# Patient Record
Sex: Male | Born: 1995 | Race: Black or African American | Hispanic: No | Marital: Married | Smoking: Never smoker
Health system: Southern US, Community
[De-identification: ages and names within clinical notes are randomized; demographics above are authoritative.]

## PROBLEM LIST (undated history)

## (undated) DIAGNOSIS — J45909 Unspecified asthma, uncomplicated: Secondary | ICD-10-CM

---
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
## 2016-04-10 ENCOUNTER — Emergency Department (HOSPITAL_COMMUNITY): Payer: BLUE CROSS/BLUE SHIELD

## 2016-04-10 ENCOUNTER — Emergency Department (HOSPITAL_COMMUNITY)
Admission: EM | Admit: 2016-04-10 | Discharge: 2016-04-10 | Disposition: A | Discharge status: Home / Self Care | Payer: BLUE CROSS/BLUE SHIELD | Attending: Emergency Medicine | Admitting: Emergency Medicine

## 2016-04-10 ENCOUNTER — Encounter (HOSPITAL_COMMUNITY): Payer: Self-pay | Admitting: Emergency Medicine

## 2016-04-10 DIAGNOSIS — R0789 Other chest pain: Secondary | ICD-10-CM | POA: Diagnosis present

## 2016-04-10 DIAGNOSIS — J45909 Unspecified asthma, uncomplicated: Secondary | ICD-10-CM | POA: Insufficient documentation

## 2016-04-10 DIAGNOSIS — Z79899 Other long term (current) drug therapy: Secondary | ICD-10-CM | POA: Diagnosis not present

## 2016-04-10 HISTORY — DX: Unspecified asthma, uncomplicated: J45.909

## 2016-04-10 LAB — BASIC METABOLIC PANEL
ANION GAP: 11 (ref 5–15)
BUN: 14 mg/dL (ref 6–20)
CALCIUM: 9.5 mg/dL (ref 8.9–10.3)
CO2: 26 mmol/L (ref 22–32)
Chloride: 101 mmol/L (ref 101–111)
Creatinine, Ser: 1.01 mg/dL (ref 0.61–1.24)
GLUCOSE: 99 mg/dL (ref 65–99)
POTASSIUM: 3.5 mmol/L (ref 3.5–5.1)
SODIUM: 138 mmol/L (ref 135–145)

## 2016-04-10 LAB — CBC
HEMATOCRIT: 44.5 % (ref 39.0–52.0)
HEMOGLOBIN: 14.7 g/dL (ref 13.0–17.0)
MCH: 27.9 pg (ref 26.0–34.0)
MCHC: 33 g/dL (ref 30.0–36.0)
MCV: 84.6 fL (ref 78.0–100.0)
Platelets: 229 10*3/uL (ref 150–400)
RBC: 5.26 MIL/uL (ref 4.22–5.81)
RDW: 12.3 % (ref 11.5–15.5)
WBC: 4.5 10*3/uL (ref 4.0–10.5)

## 2016-04-10 LAB — I-STAT TROPONIN, ED: TROPONIN I, POC: 0 ng/mL (ref 0.00–0.08)

## 2016-04-10 NOTE — ED Triage Notes (Signed)
Pt c/o substernal to L sided CP x 2 days, worse when taking deep breaths or bending over. Denies SOB/dizziness/N/V. Resp e/u, skin warm/dry.

## 2016-04-10 NOTE — ED Notes (Signed)
Pt verbalized understanding of d/c instructions and has no further questions. Pt stable and NAD. Lungs clear and patient is pain free upon d/c.

## 2016-04-10 NOTE — ED Provider Notes (Signed)
[facility]-EMERGENCY DEPT Provider Note   CSN: [id number] Arrival date & time: 04/10/16  1507     History   Chief Complaint Chief Complaint  Patient presents with  . Chest Pain    HPI Rodney Lucero is a 21 y.o. male.  Patient is an otherwise healthy 21 year old male presents with complaints of chest discomfort. This is located in the center of his chest and is not associated with any shortness of breath, nausea, diaphoresis, or radiation to the arm or jaw. He denies any exertional component. His pain is worse with sitting upright.   The history is provided by the patient.  Chest Pain   This is a new problem. The current episode started 2 days ago. The problem occurs constantly. The problem has been gradually worsening. The pain is mild. The quality of the pain is described as pressure-like. The pain does not radiate. The symptoms are aggravated by certain positions. Pertinent negatives include no cough, no diaphoresis, no fever and no shortness of breath.    Past Medical History:  Diagnosis Date  . Asthma     There are no active problems to display for this patient.   History reviewed. No pertinent surgical history.     Home Medications    Prior to Admission medications   Not on File    Family History No family history on file.  Social History Social History  Substance Use Topics  . Smoking status: Never Smoker  . Smokeless tobacco: Never Used  . Alcohol use Yes     Comment: occ     Allergies   Patient has no known allergies.   Review of Systems Review of Systems  Constitutional: Negative for diaphoresis and fever.  Respiratory: Negative for cough and shortness of breath.   Cardiovascular: Positive for chest pain.  All other systems reviewed and are negative.    Physical Exam Updated Vital Signs BP 115/57 (BP Location: Right Arm)   Pulse (!) 57   Temp (P) 98.5 F (36.9 C) (Oral)   Resp 15   Ht 5\' 9"  (1.753 m)   Wt 145 lb (65.8 kg)   SpO2 100%    BMI 21.41 kg/m   Physical Exam  Constitutional: He is oriented to person, place, and time. He appears well-developed and well-nourished. No distress.  HENT:  Head: Normocephalic and atraumatic.  Mouth/Throat: Oropharynx is clear and moist.  Neck: Normal range of motion. Neck supple.  Cardiovascular: Normal rate and regular rhythm.  Exam reveals no friction rub.   No murmur heard. Pulmonary/Chest: Effort normal and breath sounds normal. No respiratory distress. He has no wheezes. He has no rales.  Abdominal: Soft. Bowel sounds are normal. He exhibits no distension. There is no tenderness.  Musculoskeletal: Normal range of motion. He exhibits no edema.  There is no calf tenderness or swelling. Homans sign is absent bilaterally.  Neurological: He is alert and oriented to person, place, and time. Coordination normal.  Skin: Skin is warm and dry. He is not diaphoretic.  Nursing note and vitals reviewed.    ED Treatments / Results  Labs (all labs ordered are listed, but only abnormal results are displayed) Labs Reviewed  BASIC METABOLIC PANEL  CBC  I-STAT TROPOININ, ED    EKG  EKG Interpretation  Date/Time:  Tuesday April 10 2016 15:19:49 EDT Ventricular Rate:  76 PR Interval:  124 QRS Duration: 86 QT Interval:  378 QTC Calculation: 425 R Axis:   60 Text Interpretation:  Normal sinus rhythm  with sinus arrhythmia Right atrial enlargement Borderline ECG Confirmed by Aloys Hupfer  MD, Chery Giusto ([id number]) on 04/10/2016 6:55:36 PM       Radiology Dg Chest 2 View  Result Date: 04/10/2016 CLINICAL DATA:  Left-sided chest pain with inspiration EXAM: CHEST  2 VIEW COMPARISON:  None. FINDINGS: The heart size and mediastinal contours are within normal limits. Both lungs are clear. The visualized skeletal structures are unremarkable. IMPRESSION: No active cardiopulmonary disease. Electronically Signed   By: Alcide Clever M.D.   On: 04/10/2016 16:15    Procedures Procedures (including critical  care time)  Medications Ordered in ED Medications - No data to display   Initial Impression / Assessment and Plan / ED Course  I have reviewed the triage vital signs and the nursing notes.  Pertinent labs & imaging results that were available during my care of the patient were reviewed by me and considered in my medical decision making (see chart for details).  Patient with atypical symptoms, no risk factors, and negative workup. This will be treated as if it is a musculoskeletal pain. I will advise ibuprofen, rest, and as needed follow-up/return.  Final Clinical Impressions(s) / ED Diagnoses   Final diagnoses:  None    New Prescriptions New Prescriptions   No medications on file     Geoffery Lyons, MD 04/10/16 [phone]

## 2016-04-10 NOTE — Discharge Instructions (Signed)
Ibuprofen 600 mg 3 times daily for the next 5 days.  Return to the emergency department if you develop worsening pain, difficulty breathing, or other new and concerning symptoms.

## 2016-04-17 ENCOUNTER — Encounter (HOSPITAL_COMMUNITY): Payer: Self-pay | Admitting: Emergency Medicine

## 2016-04-17 ENCOUNTER — Emergency Department (HOSPITAL_COMMUNITY): Payer: BLUE CROSS/BLUE SHIELD

## 2016-04-17 ENCOUNTER — Emergency Department (HOSPITAL_COMMUNITY)
Admission: EM | Admit: 2016-04-17 | Discharge: 2016-04-17 | Disposition: A | Discharge status: Home / Self Care | Payer: BLUE CROSS/BLUE SHIELD | Attending: Emergency Medicine | Admitting: Emergency Medicine

## 2016-04-17 DIAGNOSIS — J45909 Unspecified asthma, uncomplicated: Secondary | ICD-10-CM | POA: Diagnosis not present

## 2016-04-17 DIAGNOSIS — R072 Precordial pain: Secondary | ICD-10-CM | POA: Insufficient documentation

## 2016-04-17 DIAGNOSIS — E876 Hypokalemia: Secondary | ICD-10-CM | POA: Insufficient documentation

## 2016-04-17 DIAGNOSIS — Z79899 Other long term (current) drug therapy: Secondary | ICD-10-CM | POA: Insufficient documentation

## 2016-04-17 DIAGNOSIS — R079 Chest pain, unspecified: Secondary | ICD-10-CM | POA: Diagnosis present

## 2016-04-17 LAB — I-STAT TROPONIN, ED: TROPONIN I, POC: 0 ng/mL (ref 0.00–0.08)

## 2016-04-17 LAB — BASIC METABOLIC PANEL
ANION GAP: 12 (ref 5–15)
BUN: 15 mg/dL (ref 6–20)
CHLORIDE: 100 mmol/L — AB (ref 101–111)
CO2: 24 mmol/L (ref 22–32)
Calcium: 9.7 mg/dL (ref 8.9–10.3)
Creatinine, Ser: 0.98 mg/dL (ref 0.61–1.24)
GFR calc non Af Amer: >60 mL/min (ref >60)
Glucose, Bld: 92 mg/dL (ref 65–99)
POTASSIUM: 3.3 mmol/L — AB (ref 3.5–5.1)
Sodium: 136 mmol/L (ref 135–145)

## 2016-04-17 LAB — CBC
HEMATOCRIT: 44.3 % (ref 39.0–52.0)
Hemoglobin: 15 g/dL (ref 13.0–17.0)
MCH: 28.2 pg (ref 26.0–34.0)
MCHC: 33.9 g/dL (ref 30.0–36.0)
MCV: 83.3 fL (ref 78.0–100.0)
Platelets: 253 10*3/uL (ref 150–400)
RBC: 5.32 MIL/uL (ref 4.22–5.81)
RDW: 12.4 % (ref 11.5–15.5)
WBC: 10.2 10*3/uL (ref 4.0–10.5)

## 2016-04-17 LAB — D-DIMER, QUANTITATIVE (NOT AT ARMC): D DIMER QUANT: 0.37 ug{FEU}/mL (ref 0.00–0.50)

## 2016-04-17 MED ORDER — LORAZEPAM 1 MG PO TABS
1.0000 mg | ORAL_TABLET | Freq: Once | ORAL | Status: DC
  Filled 2016-04-17 (×2): qty 1

## 2016-04-17 MED ORDER — LORAZEPAM 1 MG PO TABS
1.0000 mg | ORAL_TABLET | Freq: Once | ORAL | Status: AC
  Administered 2016-04-17: 1 mg via ORAL

## 2016-04-17 MED ORDER — POTASSIUM CHLORIDE CRYS ER 20 MEQ PO TBCR
40.0000 meq | EXTENDED_RELEASE_TABLET | Freq: Two times a day (BID) | ORAL | Status: DC
Start: 2016-04-17 — End: 2016-04-17
  Administered 2016-04-17: 40 meq via ORAL
  Filled 2016-04-17: qty 2

## 2016-04-17 NOTE — ED Triage Notes (Signed)
Pt to ED from home c/o "shaking ever since he got out of the shower about 30 minutes ago." Pt also reports intermittent chest pain (aching) for nearly 10 days, but states it is not as bad as before - was seen and treated for same about a week ago. Denies fevers, cough, SOB, dizziness, N/V. Ambulatory, A&O x 4.

## 2016-04-17 NOTE — Discharge Instructions (Signed)
You were seen today for chest pain and shaking. Your workup is only notable for slightly low potassium. Make sure you are eating potassium rich foods. Your chest pain workup is negative. Follow-up with [facility] if symptoms persist.

## 2016-04-17 NOTE — ED Provider Notes (Signed)
[facility] DEPT Provider Note   CSN: [id number] Arrival date & time: 04/17/16  0207     History   Chief Complaint Chief Complaint  Patient presents with  . Shaking  . Chest Pain    HPI Rodney Lucero is a 21 y.o. male.  HPI  This is a 21 year old male who presents with chest pain and shakes. Patient reports that he was seen and evaluated one week ago for chest pain. He was prescribed ibuprofen and states that he felt much better. However the last 24 hours he has had chest "aching". Previously the pain had been more pleuritic but now just comes and goes and is not exacerbated or relieved by anything. Currently he is not having any pain. He states that since taking a shower 4 hours ago, he has had whole body shaking that he cannot control. Denies any fevers, upper respiratory complaints, cough, congestion, shortness of breath. Reports that he does not feel anxious or stressed out.  Past Medical History:  Diagnosis Date  . Asthma     There are no active problems to display for this patient.   History reviewed. No pertinent surgical history.     Home Medications    Prior to Admission medications   Not on File    Family History No family history on file.  Social History Social History  Substance Use Topics  . Smoking status: Never Smoker  . Smokeless tobacco: Never Used  . Alcohol use Yes     Comment: occ     Allergies   Patient has no known allergies.   Review of Systems Review of Systems  Constitutional: Negative for fever.  Cardiovascular: Positive for chest pain. Negative for leg swelling.  Gastrointestinal: Negative for abdominal pain, nausea and vomiting.  Neurological:       Shakes  All other systems reviewed and are negative.    Physical Exam Updated Vital Signs BP (!) 116/54   Pulse 82   Temp 97.8 F (36.6 C) (Oral)   Resp 17   SpO2 100%   Physical Exam  Constitutional: He is oriented to person, place, and time. He appears  well-developed and well-nourished. No distress.  Occasional chills and whole body shaking noted  HENT:  Head: Normocephalic and atraumatic.  Eyes: Pupils are equal, round, and reactive to light.  Cardiovascular: Normal rate, regular rhythm and normal heart sounds.   No murmur heard. Pulmonary/Chest: Effort normal and breath sounds normal. No respiratory distress. He has no wheezes. He exhibits no tenderness.  Abdominal: Soft. Bowel sounds are normal. There is no tenderness. There is no rebound.  Musculoskeletal: He exhibits no edema.  Neurological: He is alert and oriented to person, place, and time.  Cranial nerves II through XII intact, no tremor noted with intention, no dysmetria to finger-nose-finger, 5 out of 5 strength in all 4 extremities  Skin: Skin is warm and dry.  Psychiatric: He has a normal mood and affect.  Nursing note and vitals reviewed.    ED Treatments / Results  Labs (all labs ordered are listed, but only abnormal results are displayed) Labs Reviewed  BASIC METABOLIC PANEL - Abnormal; Notable for the following:       Result Value   Potassium 3.3 (*)    Chloride 100 (*)    All other components within normal limits  CBC  D-DIMER, QUANTITATIVE (NOT AT [facility])  Rosezena Sensor, ED    EKG  EKG Interpretation  Date/Time:  Tuesday April 17 2016 02:17:46 EDT  Ventricular Rate:  92 PR Interval:  128 QRS Duration: 82 QT Interval:  354 QTC Calculation: 437 R Axis:   33 Text Interpretation:  Normal sinus rhythm with sinus arrhythmia Normal ECG When compared with ECG of 04/10/2016, No significant change was found Confirmed by [facility] CenterGLICK  MD, DAVID ([id number]) on 04/17/2016 2:21:03 AM       Radiology Dg Chest 2 View  Result Date: 04/17/2016 CLINICAL DATA:  Acute onset of mid to left-sided chest pain. Initial encounter. EXAM: CHEST  2 VIEW COMPARISON:  Chest radiograph performed 04/10/2016 FINDINGS: The lungs are well-aerated and clear. There is no evidence of focal  opacification, pleural effusion or pneumothorax. The heart is normal in size; the mediastinal contour is within normal limits. No acute osseous abnormalities are seen. IMPRESSION: No acute cardiopulmonary process seen. Electronically Signed   By: Roanna RaiderJeffery  Chang M.D.   On: 04/17/2016 03:09    Procedures Procedures (including critical care time)  Medications Ordered in ED Medications  potassium chloride SA (K-DUR,KLOR-CON) CR tablet 40 mEq (40 mEq Oral Given 04/17/16 0537)  LORazepam (ATIVAN) tablet 1 mg (1 mg Oral Not Given 04/17/16 0537)     Initial Impression / Assessment and Plan / ED Course  I have reviewed the triage vital signs and the nursing notes.  Pertinent labs & imaging results that were available during my care of the patient were reviewed by me and considered in my medical decision making (see chart for details).     Patient presents with chest pain and whole body shaking. He is nontoxic-appearing. Neuro intact. Vital signs reassuring. Initial lab work sent from triage and reassuring. He had a evaluation for chest pain 2 days ago. Given initial pleuritic nature of the pain, d-dimer was added. This was negative. He is mildly hypokalemic. He was given oral potassium and encouraged to increase potassium rich foods. He was given Ativan for shaking. No obvious acute emergent process. Follow-up with [facility]  After history, exam, and medical workup I feel the patient has been appropriately medically screened and is safe for discharge home. Pertinent diagnoses were discussed with the patient. Patient was given return precautions.   Final Clinical Impressions(s) / ED Diagnoses   Final diagnoses:  Precordial pain  Hypokalemia    New Prescriptions New Prescriptions   No medications on file     Shon Batonourtney F Akaash Vandewater, MD 04/17/16 [phone]

## 2016-04-19 ENCOUNTER — Encounter (HOSPITAL_COMMUNITY): Payer: Self-pay

## 2016-04-19 ENCOUNTER — Emergency Department (HOSPITAL_COMMUNITY)
Admission: EM | Admit: 2016-04-19 | Discharge: 2016-04-20 | Disposition: A | Discharge status: Home / Self Care | Payer: BLUE CROSS/BLUE SHIELD | Attending: Emergency Medicine | Admitting: Emergency Medicine

## 2016-04-19 DIAGNOSIS — R0789 Other chest pain: Secondary | ICD-10-CM | POA: Insufficient documentation

## 2016-04-19 DIAGNOSIS — R079 Chest pain, unspecified: Secondary | ICD-10-CM | POA: Diagnosis present

## 2016-04-19 DIAGNOSIS — Z79899 Other long term (current) drug therapy: Secondary | ICD-10-CM | POA: Insufficient documentation

## 2016-04-19 DIAGNOSIS — J45909 Unspecified asthma, uncomplicated: Secondary | ICD-10-CM | POA: Insufficient documentation

## 2016-04-19 DIAGNOSIS — F419 Anxiety disorder, unspecified: Secondary | ICD-10-CM | POA: Insufficient documentation

## 2016-04-19 NOTE — ED Notes (Signed)
Pt states that he's not having the same pain as he did earlier this week, its just uncomfortable, the tremors are new today

## 2016-04-19 NOTE — ED Notes (Signed)
Pt comes in by EMS complaining of shortness of breath and pinpoint chest tenderness, [facility] but tonight he also has tremors

## 2016-04-20 MED ORDER — HYDROXYZINE HCL 25 MG PO TABS: 25.0000 mg | ORAL_TABLET | Freq: Three times a day (TID) | ORAL | 0 refills | Status: AC | PRN

## 2016-04-20 NOTE — ED Provider Notes (Signed)
[facility]-EMERGENCY DEPT Provider Note   CSN: [id number] Arrival date & time: 04/19/16  2320     History   Chief Complaint Chief Complaint  Patient presents with  . Anxiety    HPI Rodney Lucero is a 21 y.o. male.  HPI Patient presents with chest pain. This is his third visit for the same. States he also has had some tremors. States the pain is sort of dull in his left upper chest. Has tender 1 specific spot. Worse with breathing at times. Does somewhat come and go. At times he does have shaking in his hands 2. No fevers. No hemoptysis. No swelling in his legs. No recent travel. He has had workups for this twice in the ER within the last week. He also feels somewhat anxious at times.   Past Medical History:  Diagnosis Date  . Asthma     There are no active problems to display for this patient.   History reviewed. No pertinent surgical history.     Home Medications    Prior to Admission medications   Medication Sig Start Date End Date Taking? Authorizing Provider  hydrOXYzine (ATARAX/VISTARIL) 25 MG tablet Take 1 tablet (25 mg total) by mouth every 8 (eight) hours as needed. 04/20/16   Benjiman Core, MD    Family History History reviewed. No pertinent family history.  Social History Social History  Substance Use Topics  . Smoking status: Never Smoker  . Smokeless tobacco: Never Used  . Alcohol use Yes     Comment: occ     Allergies   Patient has no known allergies.   Review of Systems Review of Systems  Constitutional: Negative for appetite change.  Cardiovascular: Positive for chest pain.  Gastrointestinal: Negative for abdominal pain.  Musculoskeletal: Negative for back pain.  Skin: Negative for pallor.  Neurological: Positive for tremors.  Psychiatric/Behavioral: The patient is nervous/anxious.      Physical Exam Updated Vital Signs BP (!) 122/96 (BP Location: Left Arm)   Pulse 94   Temp 98 F (36.7 C) (Oral)   Resp 18   SpO2 100%    Physical Exam  Constitutional: He appears well-developed.  HENT:  Head: Atraumatic.  Eyes: Pupils are equal, round, and reactive to light.  Neck: Neck supple.  Cardiovascular: Normal rate.   Pulmonary/Chest: He exhibits tenderness.  Mild tenderness to left anterior chest wall.  Abdominal: Soft.  Musculoskeletal: He exhibits no edema or tenderness.  Neurological: He is alert.  Skin: Skin is warm. Capillary refill takes less than 2 seconds.     ED Treatments / Results  Labs (all labs ordered are listed, but only abnormal results are displayed) Labs Reviewed - No data to display  EKG  EKG Interpretation  Date/Time:  Thursday April 19 2016 23:57:42 EDT Ventricular Rate:  72 PR Interval:    QRS Duration: 91 QT Interval:  372 QTC Calculation: 408 R Axis:   62 Text Interpretation:  Sinus rhythm Right atrial enlargement RSR' in V1 or V2, probably normal variant No significant change since last tracing Confirmed by Rubin Payor  MD, Harrold Donath [phone]) on 04/20/2016 12:01:30 AM       Radiology No results found.  Procedures Procedures (including critical care time)  Medications Ordered in ED Medications - No data to display   Initial Impression / Assessment and Plan / ED Course  I have reviewed the triage vital signs and the nursing notes.  Pertinent labs & imaging results that were available during my care of the patient  were reviewed by me and considered in my medical decision making (see chart for details).     Patient with chest pain. Recently worked up for same twice with negative troponins and negative d-dimer. Has had 2 chest x-rays. Likely musculoskeletal. Pericarditis felt was likely. Will discharge home. Likely has component of anxiety. It does seem to tremors at times with it. Will also DC with some Vistaril.  Final Clinical Impressions(s) / ED Diagnoses   Final diagnoses:  Chest pain, unspecified type  Anxiety    New Prescriptions New Prescriptions    HYDROXYZINE (ATARAX/VISTARIL) 25 MG TABLET    Take 1 tablet (25 mg total) by mouth every 8 (eight) hours as needed.     Benjiman CoreNathan Glora Hulgan, MD 04/20/16 [phone]

## 2016-04-22 ENCOUNTER — Emergency Department (HOSPITAL_COMMUNITY)
Admission: EM | Admit: 2016-04-22 | Discharge: 2016-04-22 | Disposition: A | Discharge status: Home / Self Care | Payer: BLUE CROSS/BLUE SHIELD | Attending: Emergency Medicine | Admitting: Emergency Medicine

## 2016-04-22 ENCOUNTER — Emergency Department (HOSPITAL_COMMUNITY): Payer: BLUE CROSS/BLUE SHIELD

## 2016-04-22 ENCOUNTER — Encounter (HOSPITAL_COMMUNITY): Payer: Self-pay | Admitting: Oncology

## 2016-04-22 DIAGNOSIS — Z79899 Other long term (current) drug therapy: Secondary | ICD-10-CM | POA: Insufficient documentation

## 2016-04-22 DIAGNOSIS — J45909 Unspecified asthma, uncomplicated: Secondary | ICD-10-CM | POA: Diagnosis not present

## 2016-04-22 DIAGNOSIS — R072 Precordial pain: Secondary | ICD-10-CM | POA: Diagnosis not present

## 2016-04-22 DIAGNOSIS — R071 Chest pain on breathing: Secondary | ICD-10-CM | POA: Diagnosis present

## 2016-04-22 LAB — COMPREHENSIVE METABOLIC PANEL
ALT: 10 U/L — ABNORMAL LOW (ref 17–63)
AST: 21 U/L (ref 15–41)
Albumin: 4.9 g/dL (ref 3.5–5.0)
Alkaline Phosphatase: 63 U/L (ref 38–126)
Anion gap: 10 (ref 5–15)
BUN: 14 mg/dL (ref 6–20)
CHLORIDE: 102 mmol/L (ref 101–111)
CO2: 24 mmol/L (ref 22–32)
Calcium: 9.6 mg/dL (ref 8.9–10.3)
Creatinine, Ser: 0.91 mg/dL (ref 0.61–1.24)
GFR calc non Af Amer: >60 mL/min (ref >60)
Glucose, Bld: 105 mg/dL — ABNORMAL HIGH (ref 65–99)
POTASSIUM: 3.7 mmol/L (ref 3.5–5.1)
Sodium: 136 mmol/L (ref 135–145)
Total Bilirubin: 1.2 mg/dL (ref 0.3–1.2)
Total Protein: 8 g/dL (ref 6.5–8.1)

## 2016-04-22 LAB — TSH: TSH: 1.283 u[IU]/mL (ref 0.350–4.500)

## 2016-04-22 LAB — CBC
HEMATOCRIT: 41.5 % (ref 39.0–52.0)
Hemoglobin: 14.2 g/dL (ref 13.0–17.0)
MCH: 27.5 pg (ref 26.0–34.0)
MCHC: 34.2 g/dL (ref 30.0–36.0)
MCV: 80.4 fL (ref 78.0–100.0)
PLATELETS: 236 10*3/uL (ref 150–400)
RBC: 5.16 MIL/uL (ref 4.22–5.81)
RDW: 11.7 % (ref 11.5–15.5)
WBC: 6.4 10*3/uL (ref 4.0–10.5)

## 2016-04-22 LAB — I-STAT TROPONIN, ED: TROPONIN I, POC: 0 ng/mL (ref 0.00–0.08)

## 2016-04-22 MED ORDER — IOPAMIDOL (ISOVUE-370) INJECTION 76%
INTRAVENOUS | Status: AC
  Filled 2016-04-22: qty 100

## 2016-04-22 MED ORDER — RANITIDINE HCL 150 MG PO TABS: 150.0000 mg | ORAL_TABLET | Freq: Two times a day (BID) | ORAL | 0 refills | Status: AC

## 2016-04-22 MED ORDER — IOPAMIDOL (ISOVUE-370) INJECTION 76%
100.0000 mL | Freq: Once | INTRAVENOUS | Status: AC | PRN
  Administered 2016-04-22: 100 mL via INTRAVENOUS

## 2016-04-22 MED ORDER — FAMOTIDINE 20 MG PO TABS
40.0000 mg | ORAL_TABLET | Freq: Once | ORAL | Status: AC
  Administered 2016-04-22: 40 mg via ORAL
  Filled 2016-04-22: qty 2

## 2016-04-22 MED ORDER — GI COCKTAIL ~~LOC~~
30.0000 mL | Freq: Once | ORAL | Status: AC
  Administered 2016-04-22: 30 mL via ORAL
  Filled 2016-04-22: qty 30

## 2016-04-22 NOTE — ED Triage Notes (Signed)
Pt presents d/t central and left sided CP.  [facility]. Pt rates pain 5/10.

## 2016-04-22 NOTE — ED Provider Notes (Signed)
[facility]-EMERGENCY DEPT Provider Note   CSN: [id number] Arrival date & time: 04/22/16  0030  By signing my name below, I, Rodney Lucero, attest that this documentation has been prepared under the direction and in the presence of [name], PA-C. Electronically Signed: Marnette Burgess Lucero, Scribe. 04/22/2016. 1:49 AM.  History   Chief Complaint Chief Complaint  Patient presents with  . Chest Pain   The history is provided by the patient and medical records. No language interpreter was used.   HPI Comments:  Rodney Lucero is a 21 y.o. male with a PMHx of Asthma, who presents to the Emergency Department complaining of left-sided, burning, 5/10 CP onset 12 days ago. He states it started as burning stemming from deep inspiration but is now always present and gradually worsening. Per chart review, this is the pt's fourth ED visit in 12 days for same. No recent fall or trauma stated. He states he is adequately hydrated, drinking lots of water today PTA. No leg swelling or calf tenderness. No h/o DVT/PE. No h/o CA or Lupus. Recent travel to [place] stated. He additionally notes mild caffeine and EtOH consumption but none in the last 24 hours. He denies cigarette, marijuana, or street drug use. He tried Vistaril PTA with minimal relief of his shaking. No alleviating factors noted while lying supine slightly exacerbates his pain. Pt denies nausea, vomiting, diaphoresis, rash, fever, cough, abdominal pain, dysuria, and pain anywhere else.   Past Medical History:  Diagnosis Date  . Asthma    There are no active problems to display for this patient.  History reviewed. No pertinent surgical history.  Home Medications    Prior to Admission medications   Medication Sig Start Date End Date Taking? Authorizing Provider  hydrOXYzine (ATARAX/VISTARIL) 25 MG tablet Take 1 tablet (25 mg total) by mouth every 8 (eight) hours as needed. 04/20/16   Benjiman Core, MD  ranitidine (ZANTAC) 150 MG  tablet Take 1 tablet (150 mg total) by mouth 2 (two) times daily. 04/22/16   Dahlia Client Zeplin Aleshire, PA-C    Family History No family history on file.  Social History Social History  Substance Use Topics  . Smoking status: Never Smoker  . Smokeless tobacco: Never Used  . Alcohol use Yes     Comment: occ    Allergies   Patient has no known allergies.  Review of Systems Review of Systems  Constitutional: Negative for diaphoresis and fever.  HENT: Negative for ear pain and sinus pain.   Eyes: Negative for pain.  Respiratory: Negative for cough.   Cardiovascular: Positive for chest pain. Negative for leg swelling.  Gastrointestinal: Negative for abdominal pain, nausea and vomiting.  Genitourinary: Negative for dysuria.  Musculoskeletal: Negative for back pain, myalgias and neck pain.  Skin: Negative for rash.  All other systems reviewed and are negative.   Physical Exam Updated Vital Signs BP 136/72 (BP Location: Left Arm)   Pulse 70   Temp 98.2 F (36.8 C) (Oral)   Resp 18   Ht 5\' 9"  (1.753 m)   Wt 145 lb (65.8 kg)   SpO2 100%   BMI 21.41 kg/m   Physical Exam  Constitutional: He appears well-developed and well-nourished. No distress.  Awake, alert, nontoxic appearance  HENT:  Head: Normocephalic and atraumatic.  Mouth/Throat: Oropharynx is clear and moist. No oropharyngeal exudate.  Eyes: Conjunctivae are normal. No scleral icterus.  Neck: Normal range of motion. Neck supple.  Cardiovascular: Normal rate, regular rhythm and intact distal pulses.  Pulmonary/Chest: Effort normal and breath sounds normal. No respiratory distress. He has no wheezes. He exhibits tenderness ( Left upper anterior chest).  Equal chest expansion Clear and equal breath sounds  Abdominal: Soft. Bowel sounds are normal. He exhibits no mass. There is no tenderness. There is no rebound and no guarding.  Musculoskeletal: Normal range of motion. He exhibits no edema.  Neurological: He is alert.    Speech is clear and goal oriented Moves extremities without ataxia  Skin: Skin is warm and dry. No rash noted. He is not diaphoretic.  No petechiae, purpura No Splinter hemorrhages No track marks  Psychiatric: He has a normal mood and affect.  Nursing note and vitals reviewed.    ED Treatments / Results  DIAGNOSTIC STUDIES:  Oxygen Saturation is 100% on RA, normal by my interpretation.    COORDINATION OF CARE:  1:49 AM Discussed treatment plan with pt at bedside including CT Chest and additional lab work and pt agreed to plan.  Labs (all labs ordered are listed, but only abnormal results are displayed) Labs Reviewed  COMPREHENSIVE METABOLIC PANEL - Abnormal; Notable for the following:       Result Value   Glucose, Bld 105 (*)    ALT 10 (*)    All other components within normal limits  CBC  TSH  T4  I-STAT TROPOININ, ED    EKG  EKG Interpretation  Date/Time:  Sunday April 22 2016 01:38:18 EDT Ventricular Rate:  82 PR Interval:    QRS Duration: 89 QT Interval:  361 QTC Calculation: 422 R Axis:   57 Text Interpretation:  Sinus rhythm Biatrial enlargement RSR' in V1 or V2, probably normal variant No significant change was found Confirmed by CAMPOS  MD, Caryn BeeKEVIN ([id number]) on 04/22/2016 2:36:01 AM       Radiology Ct Angio Chest Pe W Or Wo Contrast  Result Date: 04/22/2016 CLINICAL DATA:  21 year old male with chest pain. EXAM: CT ANGIOGRAPHY CHEST WITH CONTRAST TECHNIQUE: Multidetector CT imaging of the chest was performed using the standard protocol during bolus administration of intravenous contrast. Multiplanar CT image reconstructions and MIPs were obtained to evaluate the vascular anatomy. CONTRAST:  100 cc Isovue 370 COMPARISON:  Chest radiograph dated 04/17/2016 FINDINGS: Cardiovascular: There is no cardiomegaly or pericardial effusion. The thoracic aorta is unremarkable. No CT evidence of pulmonary embolism. Mediastinum/Nodes: No hilar or mediastinal adenopathy. The  esophagus and the thyroid gland are grossly unremarkable. Lungs/Pleura: The lungs are clear. There is no pleural effusion or pneumothorax. The central airways are patent. Upper Abdomen: No acute abnormality. Musculoskeletal: No chest wall abnormality. No acute or significant osseous findings. Review of the MIP images confirms the above findings. IMPRESSION: No acute intrathoracic pathology. No CT evidence of pulmonary embolism. Electronically Signed   By: Elgie CollardArash  Radparvar M.D.   On: 04/22/2016 03:23    Procedures Procedures (including critical care time)  Medications Ordered in ED Medications  iopamidol (ISOVUE-370) 76 % injection (not administered)  gi cocktail (Maalox,Lidocaine,Donnatal) (30 mLs Oral Given 04/22/16 0205)  iopamidol (ISOVUE-370) 76 % injection 100 mL (100 mLs Intravenous Contrast Given 04/22/16 0306)  famotidine (PEPCID) tablet 40 mg (40 mg Oral Given 04/22/16 0347)     Initial Impression / Assessment and Plan / ED Course  I have reviewed the triage vital signs and the nursing notes.  Pertinent labs & imaging results that were available during my care of the patient were reviewed by me and considered in my medical decision making (see chart for details).  Patient presents with waxing and waning chest pain for almost 2 weeks. This is his fourth evaluation in the emergency department in the last 12 days. Chest pain is not likely of cardiac or pulmonary etiology d/t presentation, PERC negative, VSS, no tracheal deviation, no JVD or new murmur, RRR, breath sounds equal bilaterally, EKG without acute abnormalities, negative troponin, and negative CT scan.  Air is no evidence of PE, pericardial effusion, pneumonia, hyperthyroid or hypothyroidism, ACS. I have recommended the patient continue outpatient workup for his chest pain at the [facility]. Pt has been advised to return to the ED if CP becomes exertional, associated with diaphoresis or nausea, radiates to left  jaw/arm, worsens or becomes concerning in any way. Pt appears reliable for follow up and is agreeable to discharge.    Final Clinical Impressions(s) / ED Diagnoses   Final diagnoses:  Precordial pain    New Prescriptions New Prescriptions   RANITIDINE (ZANTAC) 150 MG TABLET    Take 1 tablet (150 mg total) by mouth 2 (two) times daily.   I personally performed the services described in this documentation, which was scribed in my presence. The recorded information has been reviewed and is accurate.     Dahlia Client Uzair Godley, PA-C 04/22/16 [id number]    Azalia Bilis, MD 04/23/16 [phone]

## 2016-04-22 NOTE — Discharge Instructions (Signed)
1. Medications: Zantac, usual home medications 2. Treatment: rest, drink plenty of fluids,  3. Follow Up: Please followup with your primary doctor in 2-3 days for discussion of your diagnoses and further evaluation after today's visit; if you do not have a primary care doctor use the resource guide provided to find one; Please return to the ER for that is associated with lightheadedness, passing out, persistent vomiting, high fevers or other concerns.

## 2016-04-23 LAB — T4: T4 TOTAL: 7.9 ug/dL (ref 4.5–12.0)

## 2016-05-05 ENCOUNTER — Other Ambulatory Visit: Payer: Self-pay | Admitting: Gastroenterology

## 2016-05-05 DIAGNOSIS — R079 Chest pain, unspecified: Secondary | ICD-10-CM

## 2016-05-05 DIAGNOSIS — K219 Gastro-esophageal reflux disease without esophagitis: Secondary | ICD-10-CM

## 2016-05-18 ENCOUNTER — Encounter (HOSPITAL_COMMUNITY)
Admission: RE | Admit: 2016-05-18 | Discharge: 2016-05-18 | Disposition: A | Discharge status: Home / Self Care | Payer: BLUE CROSS/BLUE SHIELD | Source: Ambulatory Visit | Attending: Gastroenterology | Admitting: Gastroenterology

## 2016-05-18 ENCOUNTER — Ambulatory Visit (HOSPITAL_COMMUNITY)
Admission: RE | Admit: 2016-05-18 | Discharge: 2016-05-18 | Disposition: A | Discharge status: Home / Self Care | Payer: BLUE CROSS/BLUE SHIELD | Source: Ambulatory Visit | Attending: Gastroenterology | Admitting: Gastroenterology

## 2016-05-18 DIAGNOSIS — R079 Chest pain, unspecified: Secondary | ICD-10-CM

## 2016-05-18 DIAGNOSIS — K219 Gastro-esophageal reflux disease without esophagitis: Secondary | ICD-10-CM | POA: Diagnosis present

## 2016-05-18 MED ORDER — TECHNETIUM TC 99M MEBROFENIN IV KIT
5.0000 | PACK | Freq: Once | INTRAVENOUS | Status: AC
  Administered 2016-05-18: 5 via INTRAVENOUS

## 2016-05-30 ENCOUNTER — Other Ambulatory Visit: Payer: Self-pay | Admitting: Surgery

## 2018-07-24 IMAGING — CR DG CHEST 2V
2 series · 2 of 2 positions shown · non-contrast
Comparison: None.

CLINICAL DATA: Left-sided chest pain with inspiration

EXAM:
CHEST  2 VIEW

[chest pa]
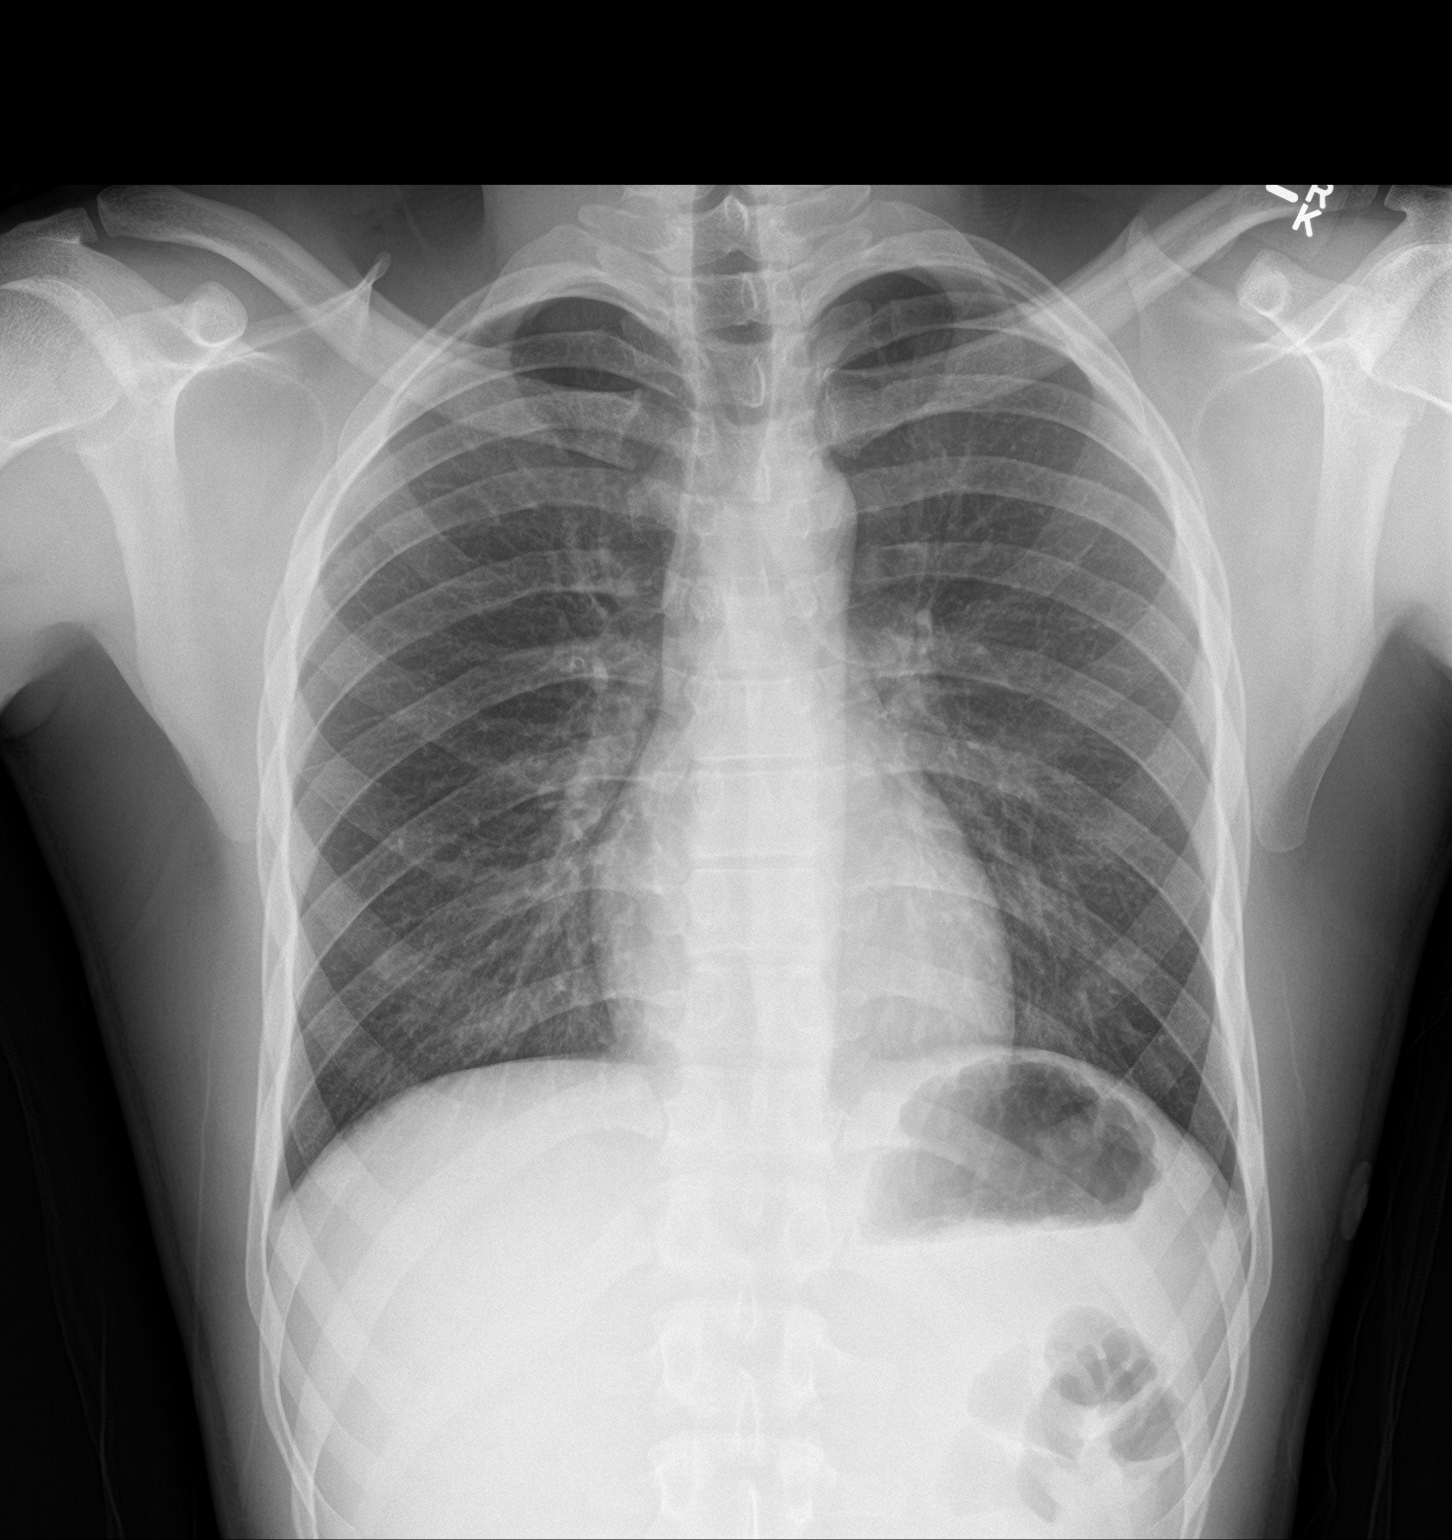

[chest lat]
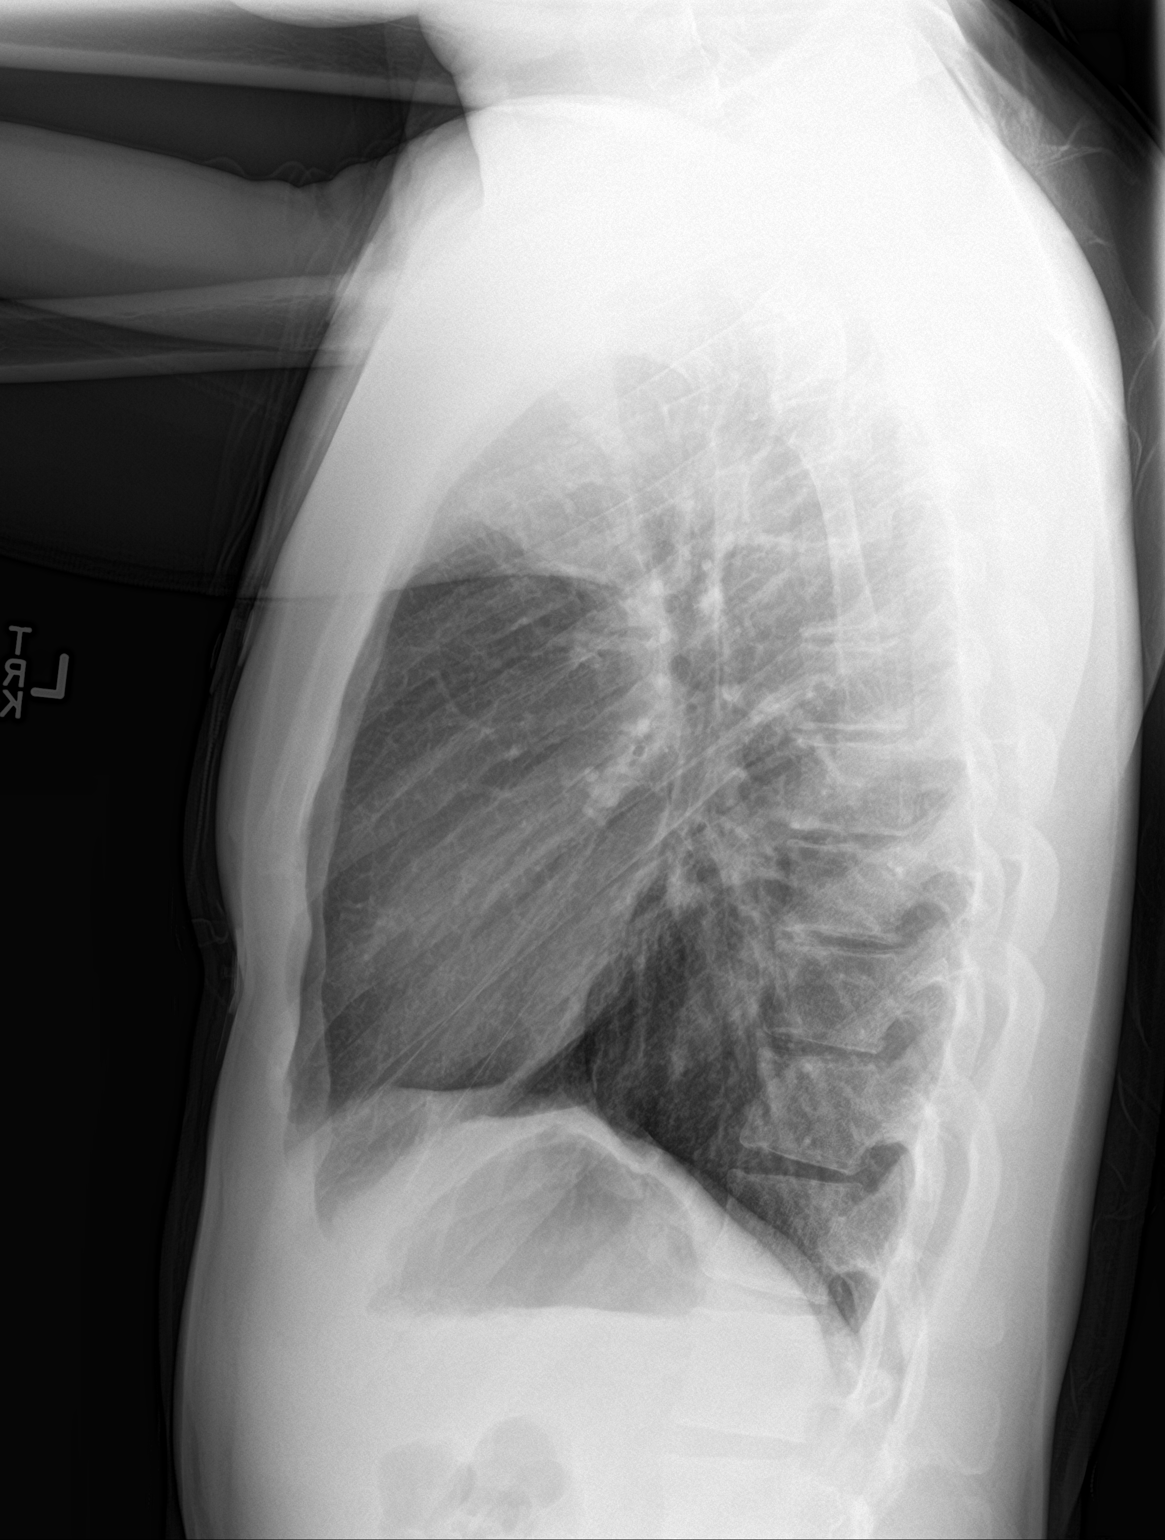

[2 of 2 positions shown; findings below may reference images not displayed]

FINDINGS: The heart size and mediastinal contours are within normal limits.
Both lungs are clear. The visualized skeletal structures are
unremarkable.
IMPRESSION: No active cardiopulmonary disease.

## 2018-08-05 IMAGING — CT CT ANGIO CHEST
2 of 7 series · 19 of 46 positions shown · IV contrast (isovue)
Comparison: Chest radiograph dated 04/17/2016

CLINICAL DATA: 21-year-old male with chest pain.

EXAM:
CT ANGIOGRAPHY CHEST WITH CONTRAST
TECHNIQUE: Multidetector CT imaging of the chest was performed using the
standard protocol during bolus administration of intravenous
contrast. Multiplanar CT image reconstructions and MIPs were
obtained to evaluate the vascular anatomy.
CONTRAST:  100 cc Isovue 370

[Series 6: thins for pacs · axial · 0.69mm/px · z∈[-329,-65]mm · 16 of 290 slices shown]
[im 13/290  lung]
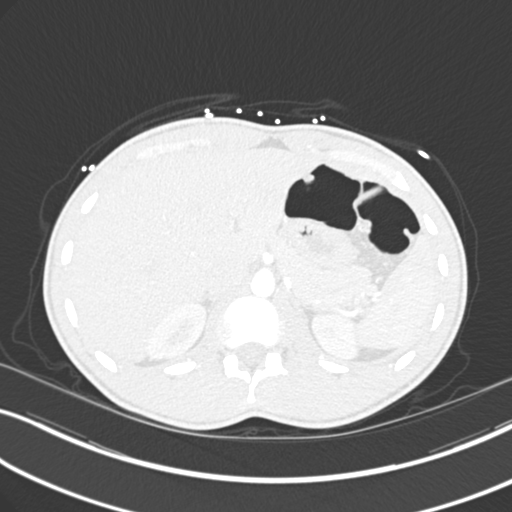
[im 38/290  soft-tissue]
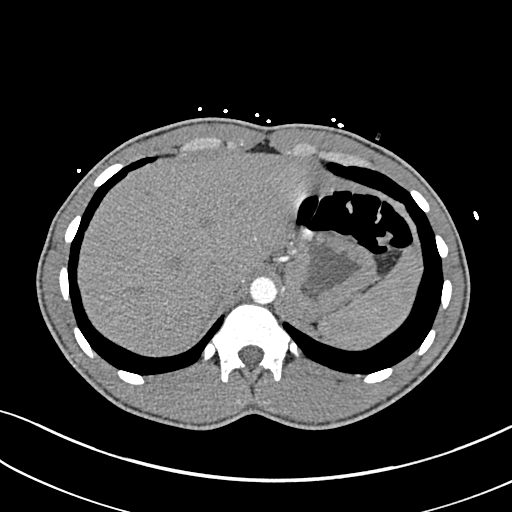
[im 51/290  lung]
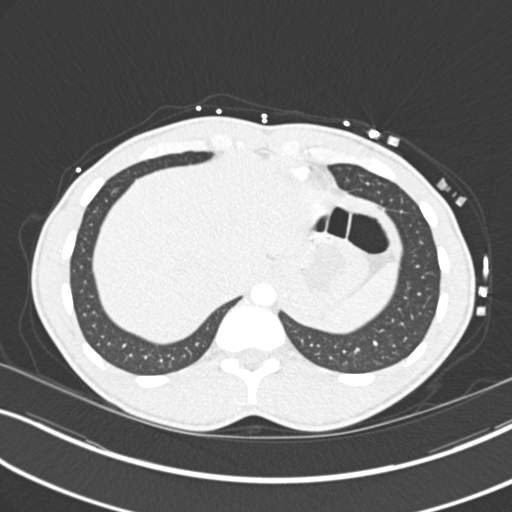
[im 63/290  soft-tissue]
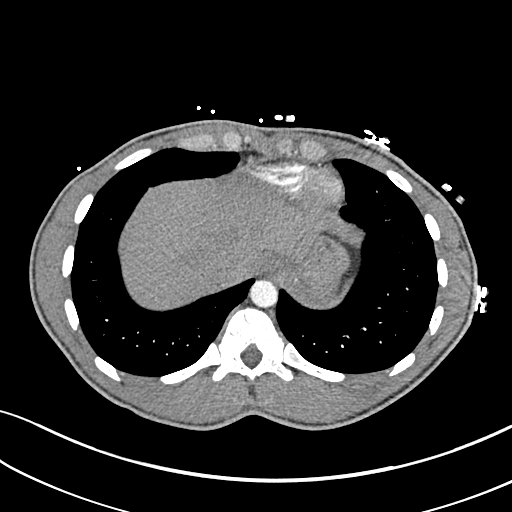
[im 88/290  lung]
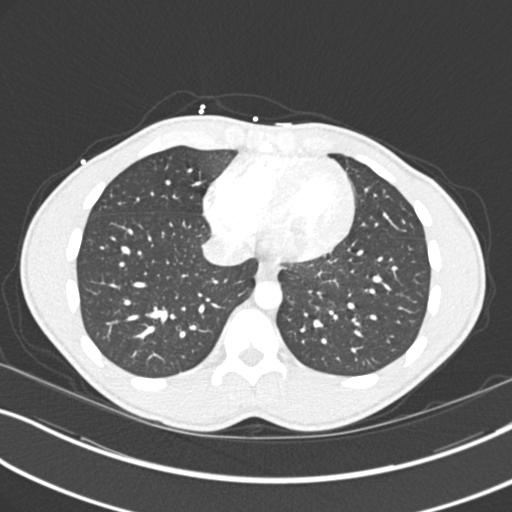
[im 101/290  soft-tissue]
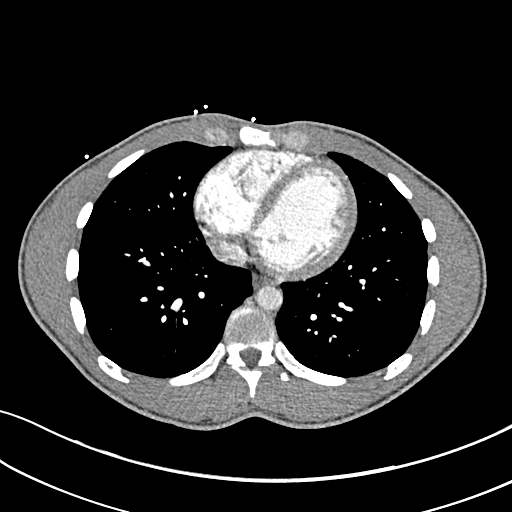
[im 114/290  lung]
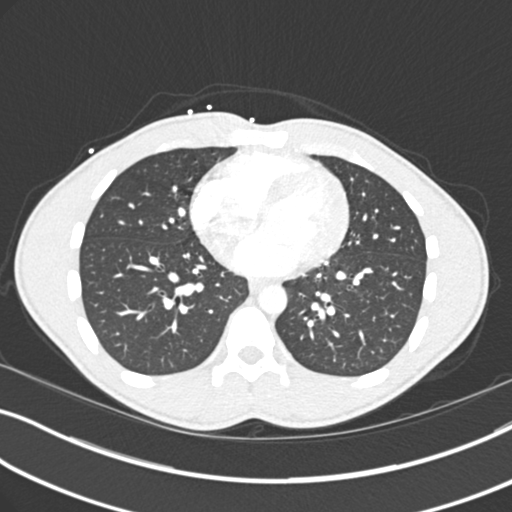
[im 139/290  soft-tissue]
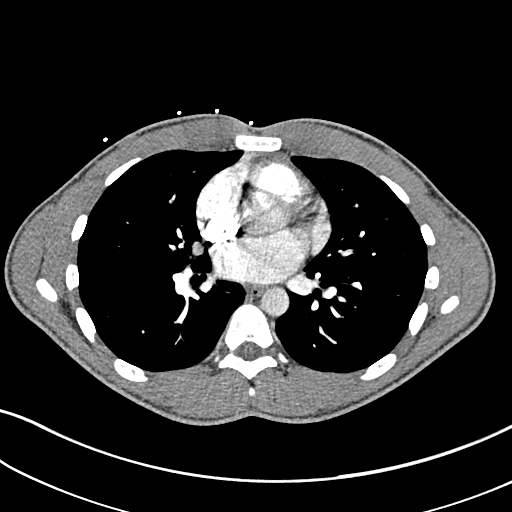
[im 151/290  lung]
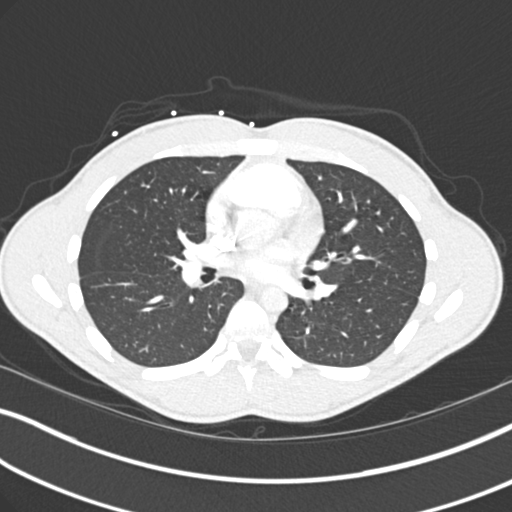
[im 176/290  soft-tissue]
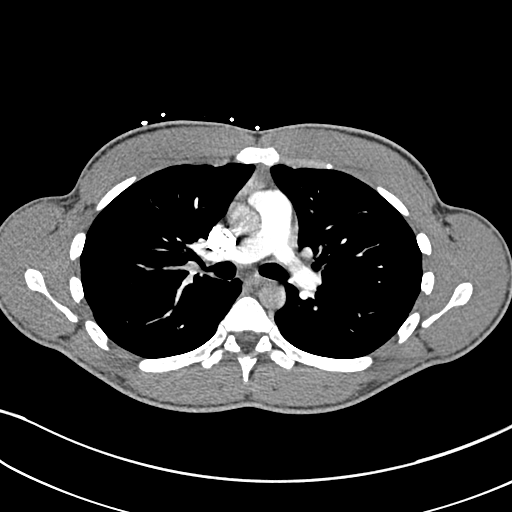
[im 189/290  lung]
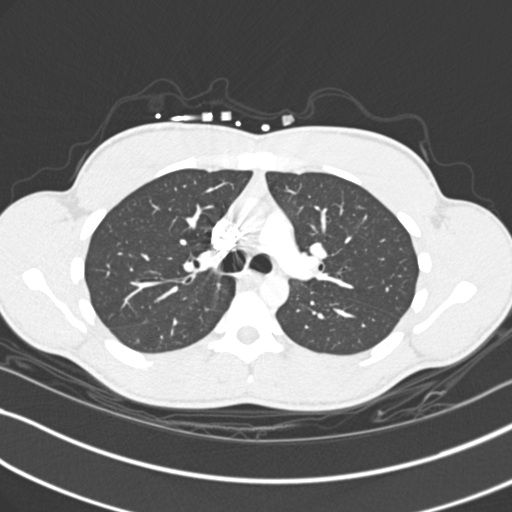
[im 202/290  soft-tissue]
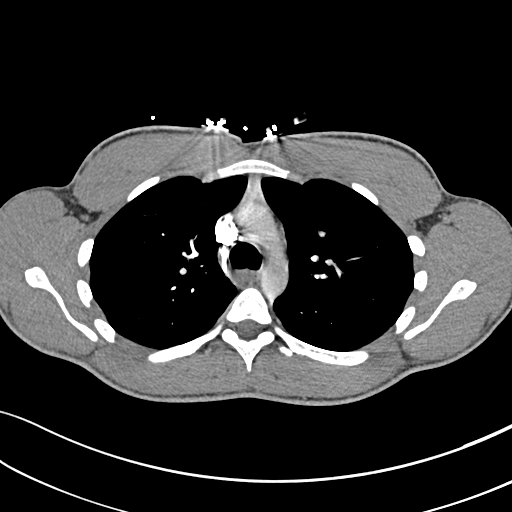
[im 227/290  lung]
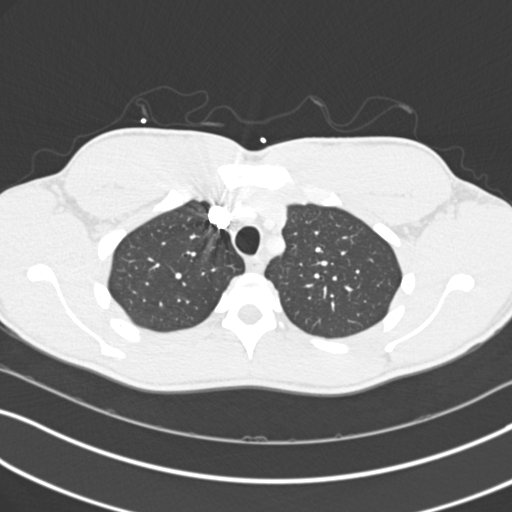
[im 239/290  soft-tissue]
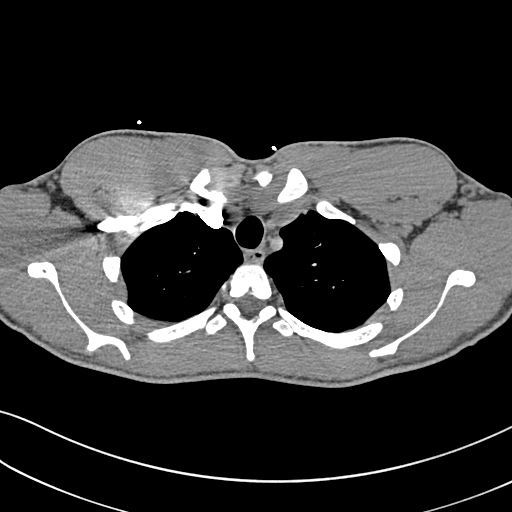
[im 252/290  lung]
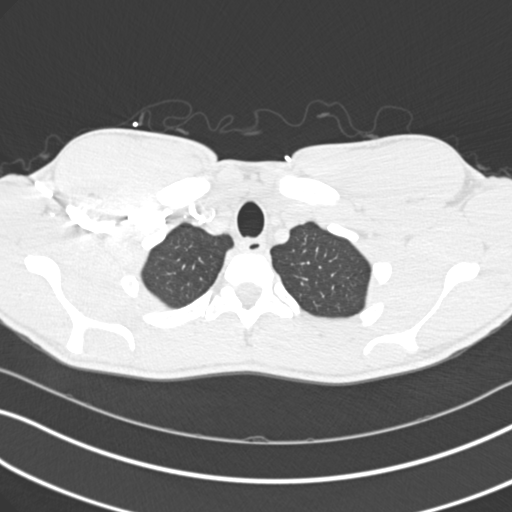
[im 277/290  soft-tissue]
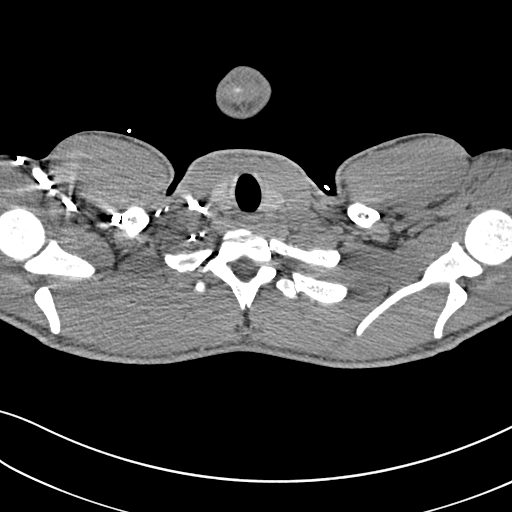

[Series 8: coronal mpr · coronal · 0.58mm/px · 3 of 106 slices shown]
[im 27/106  soft-tissue]
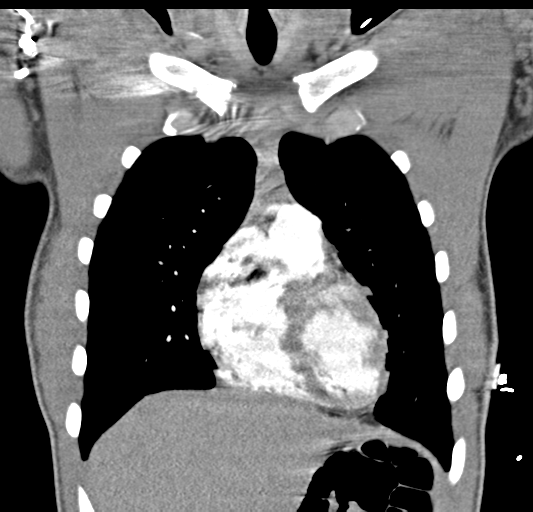
[im 53/106  soft-tissue]
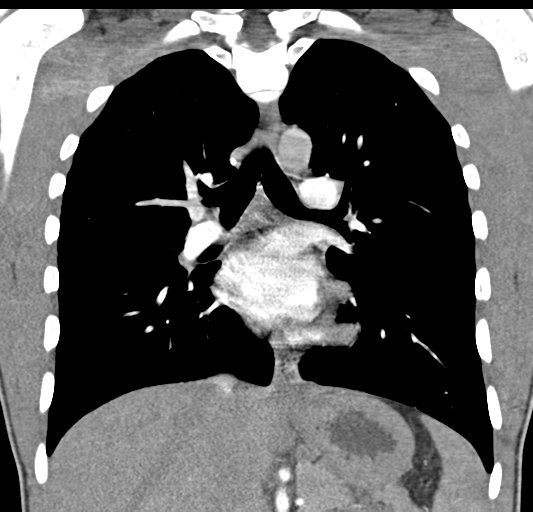
[im 79/106  soft-tissue]
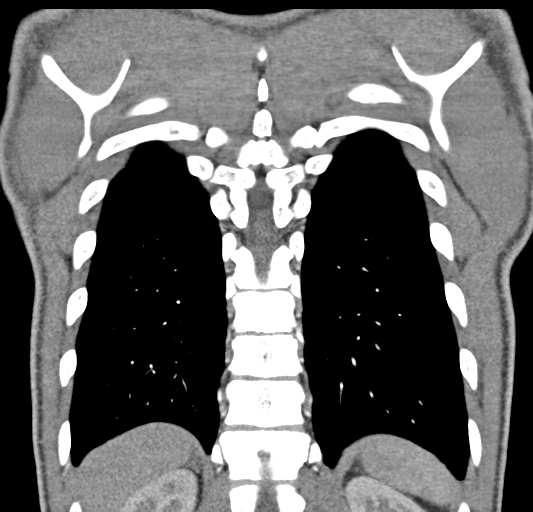

[19 of 46 positions shown; findings below may reference images not displayed]

FINDINGS: Cardiovascular: There is no cardiomegaly or pericardial effusion.
The thoracic aorta is unremarkable. No CT evidence of pulmonary
embolism.

Mediastinum/Nodes: No hilar or mediastinal adenopathy. The esophagus
and the thyroid gland are grossly unremarkable.

Lungs/Pleura: The lungs are clear. There is no pleural effusion or
pneumothorax. The central airways are patent.

Upper Abdomen: No acute abnormality.

Musculoskeletal: No chest wall abnormality. No acute or significant
osseous findings.

Review of the MIP images confirms the above findings.
IMPRESSION: No acute intrathoracic pathology. No CT evidence of pulmonary
embolism.
